# Patient Record
Sex: Male | Born: 1970 | Hispanic: No | Marital: Married | Smoking: Never smoker
Health system: Southern US, Community
[De-identification: ages and names within clinical notes are randomized; demographics above are authoritative.]

## PROBLEM LIST (undated history)

## (undated) DIAGNOSIS — E7800 Pure hypercholesterolemia, unspecified: Secondary | ICD-10-CM

## (undated) DIAGNOSIS — E119 Type 2 diabetes mellitus without complications: Secondary | ICD-10-CM

---

## 2017-08-04 ENCOUNTER — Encounter (HOSPITAL_BASED_OUTPATIENT_CLINIC_OR_DEPARTMENT_OTHER): Payer: Self-pay | Admitting: *Deleted

## 2017-08-04 ENCOUNTER — Emergency Department (HOSPITAL_BASED_OUTPATIENT_CLINIC_OR_DEPARTMENT_OTHER)
Admission: EM | Admit: 2017-08-04 | Discharge: 2017-08-04 | Disposition: A | Discharge status: Home / Self Care | Payer: 59 | Attending: Emergency Medicine | Admitting: Emergency Medicine

## 2017-08-04 ENCOUNTER — Other Ambulatory Visit: Payer: Self-pay

## 2017-08-04 ENCOUNTER — Emergency Department (HOSPITAL_BASED_OUTPATIENT_CLINIC_OR_DEPARTMENT_OTHER): Payer: 59

## 2017-08-04 DIAGNOSIS — Y999 Unspecified external cause status: Secondary | ICD-10-CM | POA: Diagnosis not present

## 2017-08-04 DIAGNOSIS — E119 Type 2 diabetes mellitus without complications: Secondary | ICD-10-CM | POA: Diagnosis not present

## 2017-08-04 DIAGNOSIS — S93402A Sprain of unspecified ligament of left ankle, initial encounter: Secondary | ICD-10-CM | POA: Insufficient documentation

## 2017-08-04 DIAGNOSIS — X500XXA Overexertion from strenuous movement or load, initial encounter: Secondary | ICD-10-CM | POA: Insufficient documentation

## 2017-08-04 DIAGNOSIS — Y9373 Activity, racquet and hand sports: Secondary | ICD-10-CM | POA: Insufficient documentation

## 2017-08-04 DIAGNOSIS — Z7984 Long term (current) use of oral hypoglycemic drugs: Secondary | ICD-10-CM | POA: Diagnosis not present

## 2017-08-04 DIAGNOSIS — S99912A Unspecified injury of left ankle, initial encounter: Secondary | ICD-10-CM | POA: Diagnosis present

## 2017-08-04 DIAGNOSIS — Y92312 Tennis court as the place of occurrence of the external cause: Secondary | ICD-10-CM | POA: Insufficient documentation

## 2017-08-04 HISTORY — DX: Pure hypercholesterolemia, unspecified: E78.00

## 2017-08-04 HISTORY — DX: Type 2 diabetes mellitus without complications: E11.9

## 2017-08-04 MED ORDER — IBUPROFEN 800 MG PO TABS
800.0000 mg | ORAL_TABLET | Freq: Once | ORAL | Status: AC
  Administered 2017-08-04: 800 mg via ORAL
  Filled 2017-08-04: qty 1

## 2017-08-04 MED ORDER — HYDROCODONE-ACETAMINOPHEN 5-325 MG PO TABS: 1.0000 | ORAL_TABLET | ORAL | 0 refills | Status: AC | PRN

## 2017-08-04 MED ORDER — IBUPROFEN 600 MG PO TABS: 600.0000 mg | ORAL_TABLET | Freq: Four times a day (QID) | ORAL | 0 refills | Status: AC | PRN

## 2017-08-04 MED ORDER — HYDROCODONE-ACETAMINOPHEN 5-325 MG PO TABS
1.0000 | ORAL_TABLET | Freq: Once | ORAL | Status: AC
  Administered 2017-08-04: 1 via ORAL
  Filled 2017-08-04: qty 1

## 2017-08-04 MED FILL — HYDROCODON-APAP 5-325: 5-325 | 2 days supply | Qty: 10 | Fill #0

## 2017-08-04 MED FILL — IBUPROFEN 600 MG TABLET: 600 | 7 days supply | Qty: 30 | Fill #0

## 2017-08-04 NOTE — ED Notes (Signed)
Pt states he is diabetic and he is hungry, given saltine crackers per request.

## 2017-08-04 NOTE — ED Triage Notes (Signed)
Injury to his left ankle while playing tennis today. Swelling noted.

## 2017-08-04 NOTE — ED Provider Notes (Signed)
[facility] EMERGENCY DEPARTMENT Provider Note   CSN: [id number] Arrival date & time: 08/04/17  1129     History   Chief Complaint Chief Complaint  Patient presents with  . Ankle Injury    HPI Justin Parks is a 46 y.o. male.  HPI   Mr. Justin Parks is a 46 year old male with a history of NIDDM who presents to the emergency department for evaluation of left ankle pain.  Patient states that he was playing tennis earlier this morning when he rolled the left ankle inwards.  States that he now has 8/10 severity left lateral ankle pain with associated swelling.  Pain is "throbbing" and worsened with ankle inversion and eversion.  Improved with ice.  He has not taken any over-the-counter medications for his symptoms.  He denies fever, open wound, numbness, weakness.  He denies previous surgeries to his ankle.  He is able to ambulate independently, although painful.  Past Medical History:  Diagnosis Date  . Diabetes mellitus without complication (HCC)   . High cholesterol     There are no active problems to display for this patient.   History reviewed. No pertinent surgical history.     Home Medications    Prior to Admission medications   Medication Sig Start Date End Date Taking? Authorizing Provider  METFORMIN & DIET MANAGE PROD PO Take by mouth.   Yes [provider]  SIMVASTATIN PO Take by mouth.   Yes [provider]  SITagliptin Phosphate (JANUVIA PO) Take by mouth.   Yes [provider]    Family History No family history on file.  Social History Social History   Tobacco Use  . Smoking status: Never Smoker  . Smokeless tobacco: Never Used  Substance Use Topics  . Alcohol use: Yes    Frequency: Never  . Drug use: No     Allergies   Patient has no known allergies.   Review of Systems Review of Systems  Constitutional: Negative for chills and fever.  Musculoskeletal: Positive for arthralgias, gait problem and  joint swelling (left ankle).  Skin: Negative for color change and wound.  Neurological: Negative for weakness and numbness.     Physical Exam Updated Vital Signs BP 109/76   Pulse (!) 106   Temp 98.7 F (37.1 C) (Oral)   Resp 16   Ht 5\' 11"  (1.803 m)   Wt 68.9 kg (152 lb)   SpO2 100%   BMI 21.20 kg/m   Physical Exam  Constitutional: He is oriented to person, place, and time. He appears well-developed and well-nourished. No distress.  HENT:  Head: Normocephalic and atraumatic.  Eyes: Right eye exhibits no discharge. Left eye exhibits no discharge.  Pulmonary/Chest: Effort normal. No respiratory distress.  Musculoskeletal:  Left ankle with  tenderness to palpation around the lateral malleolus. Left lateral malleolus with obvious swelling. Full active ROM. No erythema, ecchymosis, or deformity appreciated. No break in skin. No pain to fifth metatarsal area or navicular region. Achilles intact. Good pedal pulse and cap refill of toes. Distal sensation to light touch intact in all 5 toes of the left foot.    Neurological: He is alert and oriented to person, place, and time. Coordination normal.  Skin: Skin is warm and dry. Capillary refill takes less than 2 seconds. He is not diaphoretic.  Psychiatric: He has a normal mood and affect. His behavior is normal.  Nursing note and vitals reviewed.    ED Treatments / Results  Labs (all labs ordered  are listed, but only abnormal results are displayed) Labs Reviewed - No data to display  EKG  EKG Interpretation None       Radiology Dg Ankle Complete Left  Result Date: 08/04/2017 CLINICAL DATA:  Pain following twisting injury EXAM: LEFT ANKLE COMPLETE - 3+ VIEW COMPARISON:  None. FINDINGS: Frontal, oblique, and lateral views were obtained. There is soft tissue swelling, primarily laterally. No evident fracture or joint effusion. No appreciable joint space narrowing or erosion. Ankle mortise appears intact. There is a small spur  arising from the posterior calcaneus. IMPRESSION: Soft tissue swelling, primarily laterally. No fracture. No appreciable arthropathy. Ankle mortise appears intact. Small posterior calcaneal spur noted. Electronically Signed   By: Bretta BangWilliam  Woodruff III M.D.   On: 08/04/2017 11:55    Procedures Procedures (including critical care time)  Medications Ordered in ED Medications - No data to display   Initial Impression / Assessment and Plan / ED Course  I have reviewed the triage vital signs and the nursing notes.  Pertinent labs & imaging results that were available during my care of the patient were reviewed by me and considered in my medical decision making (see chart for details).    Patient with left lateral ankle sprain. X-ray of left ankle does not reveal fracture or acute bony abnormality.  Foot is neurovascularly intact on exam. Patient sent home with ASO brace and crutches.  Have counseled him on NSAID use and RICE protocol.  Discussed follow-up with primary doctor if his symptoms are not improving in a week.  Patient agrees and voiced understanding to the above plan and has no complaints prior to discharge.  Final Clinical Impressions(s) / ED Diagnoses   Final diagnoses:  Sprain of left ankle, unspecified ligament, initial encounter    ED Discharge Orders    None       Lawrence MarseillesShrosbree, Jaqualyn Juday J, PA-C 08/04/17 1449    Raeford RazorKohut, Stephen, MD 08/05/17 1630

## 2017-08-04 NOTE — Discharge Instructions (Signed)
X-ray of your left ankle was reassuring.  No fractures or dislocations.  You have an ankle sprain.  Please ice at least twice a day for the next several days.  Elevate the ankle when you can.   Take 600mg  ibuprofen every 6hrs for pain. I have also written you a prescription for a pain medicine called norco.  It can make you drowsy so please do not drive, work or drink alcohol while taking this medicine.  Please schedule appointment your primary doctor if your symptoms are not improving in a week.  Return to the emergency department if you have any new or worsening symptoms.

## 2017-08-04 NOTE — ED Notes (Signed)
Pt educated about not driving or performing critical tasks (such as operating heavy machinery or caring for an infant/toddler/child) while taking narcotics. Educated about not mixing narcotics with alcohol and/or other sedatives (such as muscle relaxers, Benadryl). Also educated about addicting properties of narcotics. Pt/caregiver verbalized understanding.   

## 2017-08-04 NOTE — ED Notes (Signed)
Pt educated about not driving or performing other critical tasks (such as operating heavy machinery, caring for infant/toddler/child) due to sedative nature of medications received in ED. Also warned about risks of consuming alcohol or taking other medications with sedative properties. Pt/caregiver verbalized understanding.  

## 2018-06-23 IMAGING — CR DG ANKLE COMPLETE 3+V*L*
3 series · 3 of 3 positions shown · non-contrast
Comparison: None.

CLINICAL DATA: Pain following twisting injury

EXAM:
LEFT ANKLE COMPLETE - 3+ VIEW

[t ankle joint ap left]
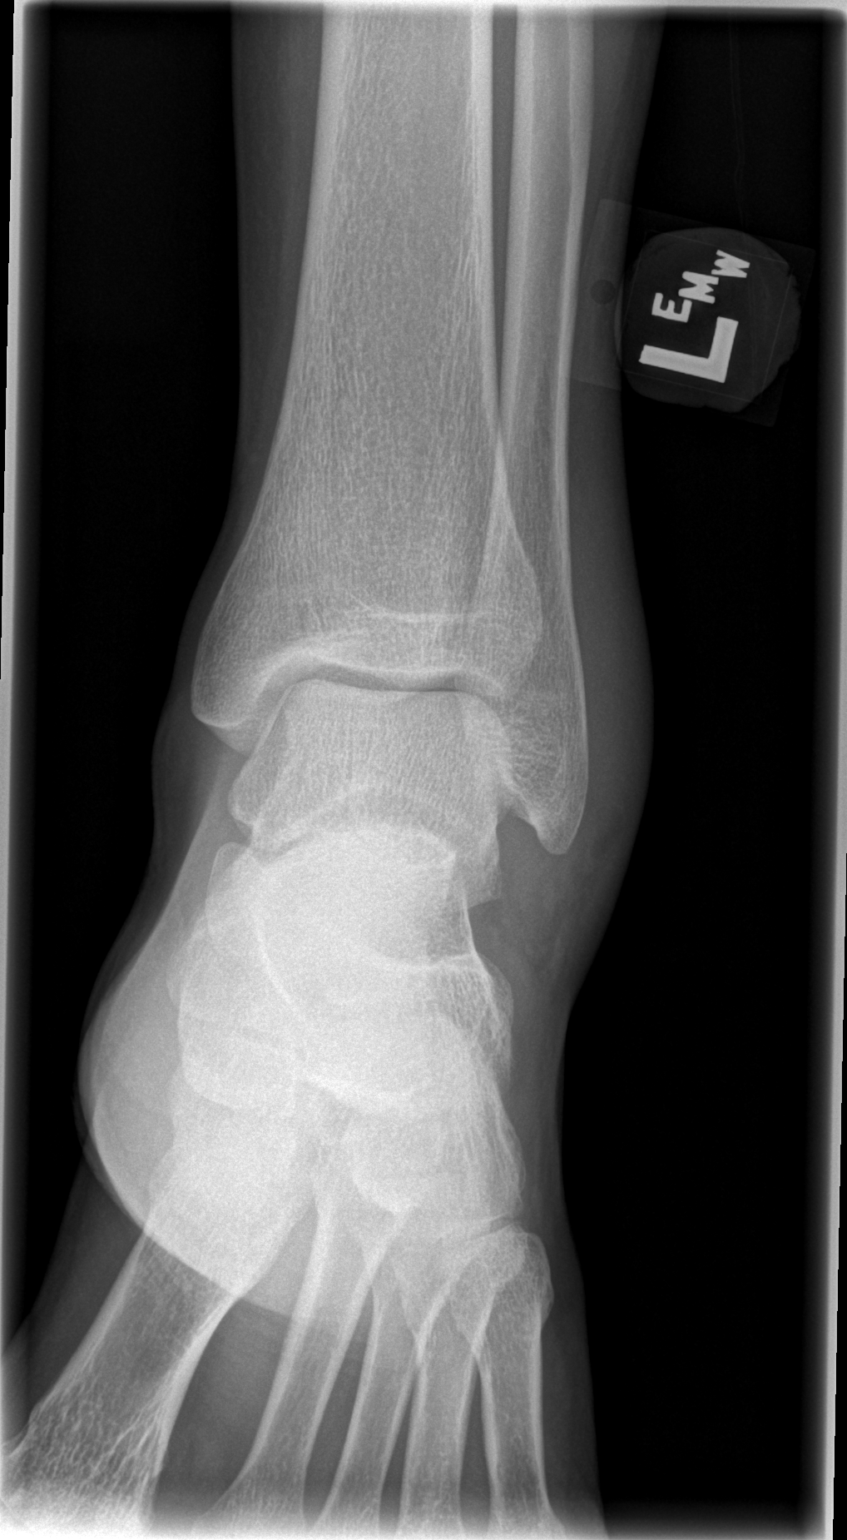

[t ankle joint oblique left]
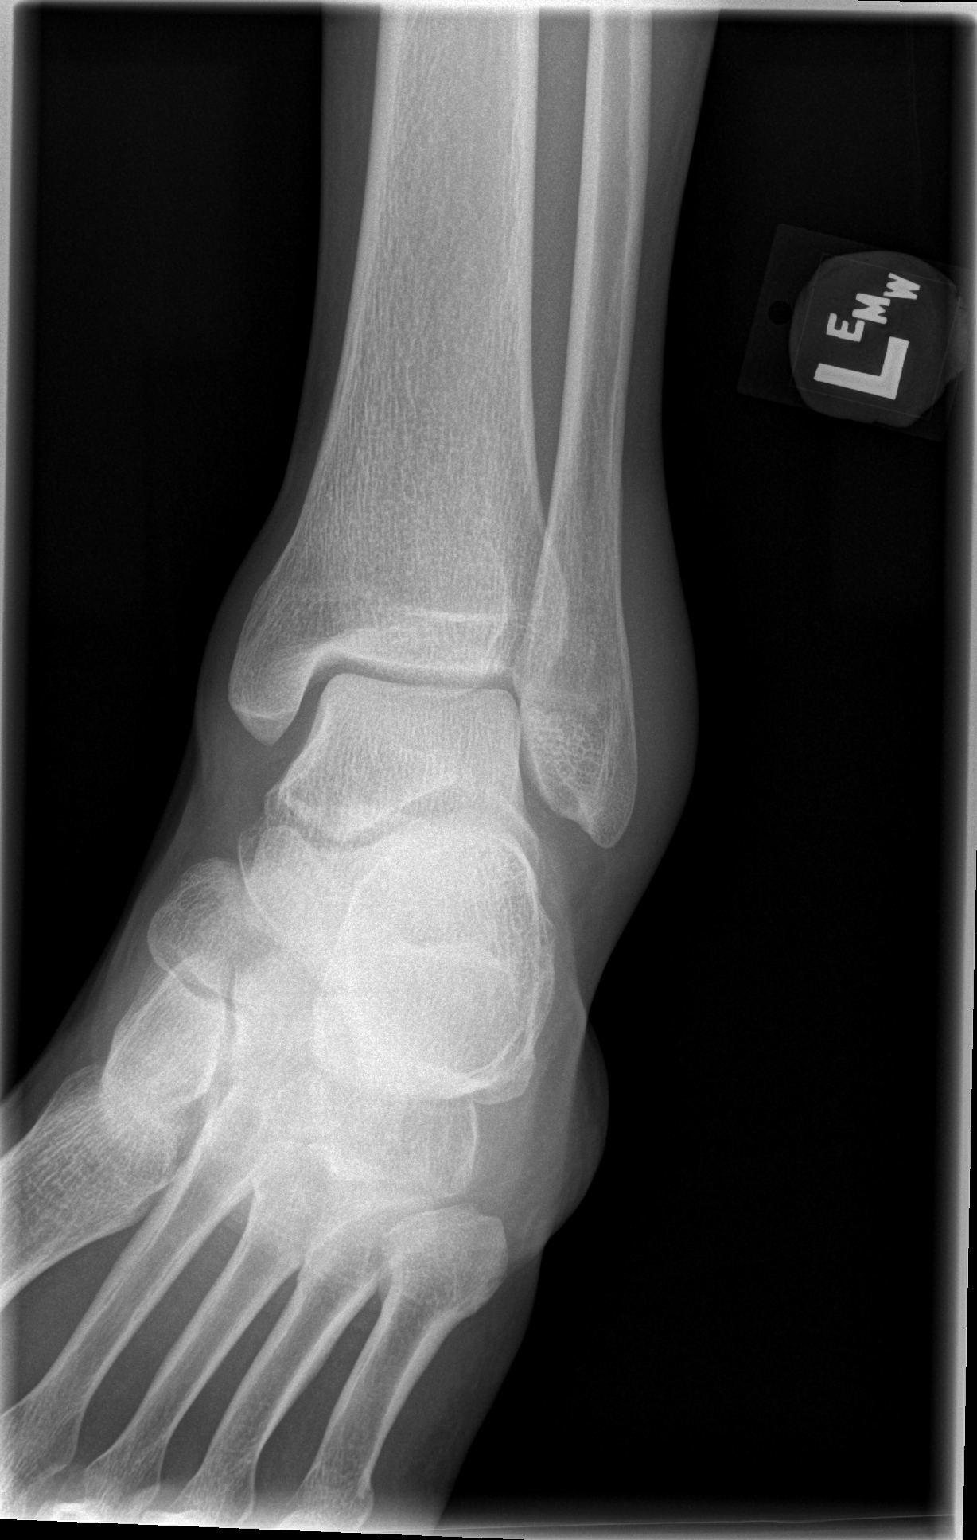

[t ankle joint lat left]
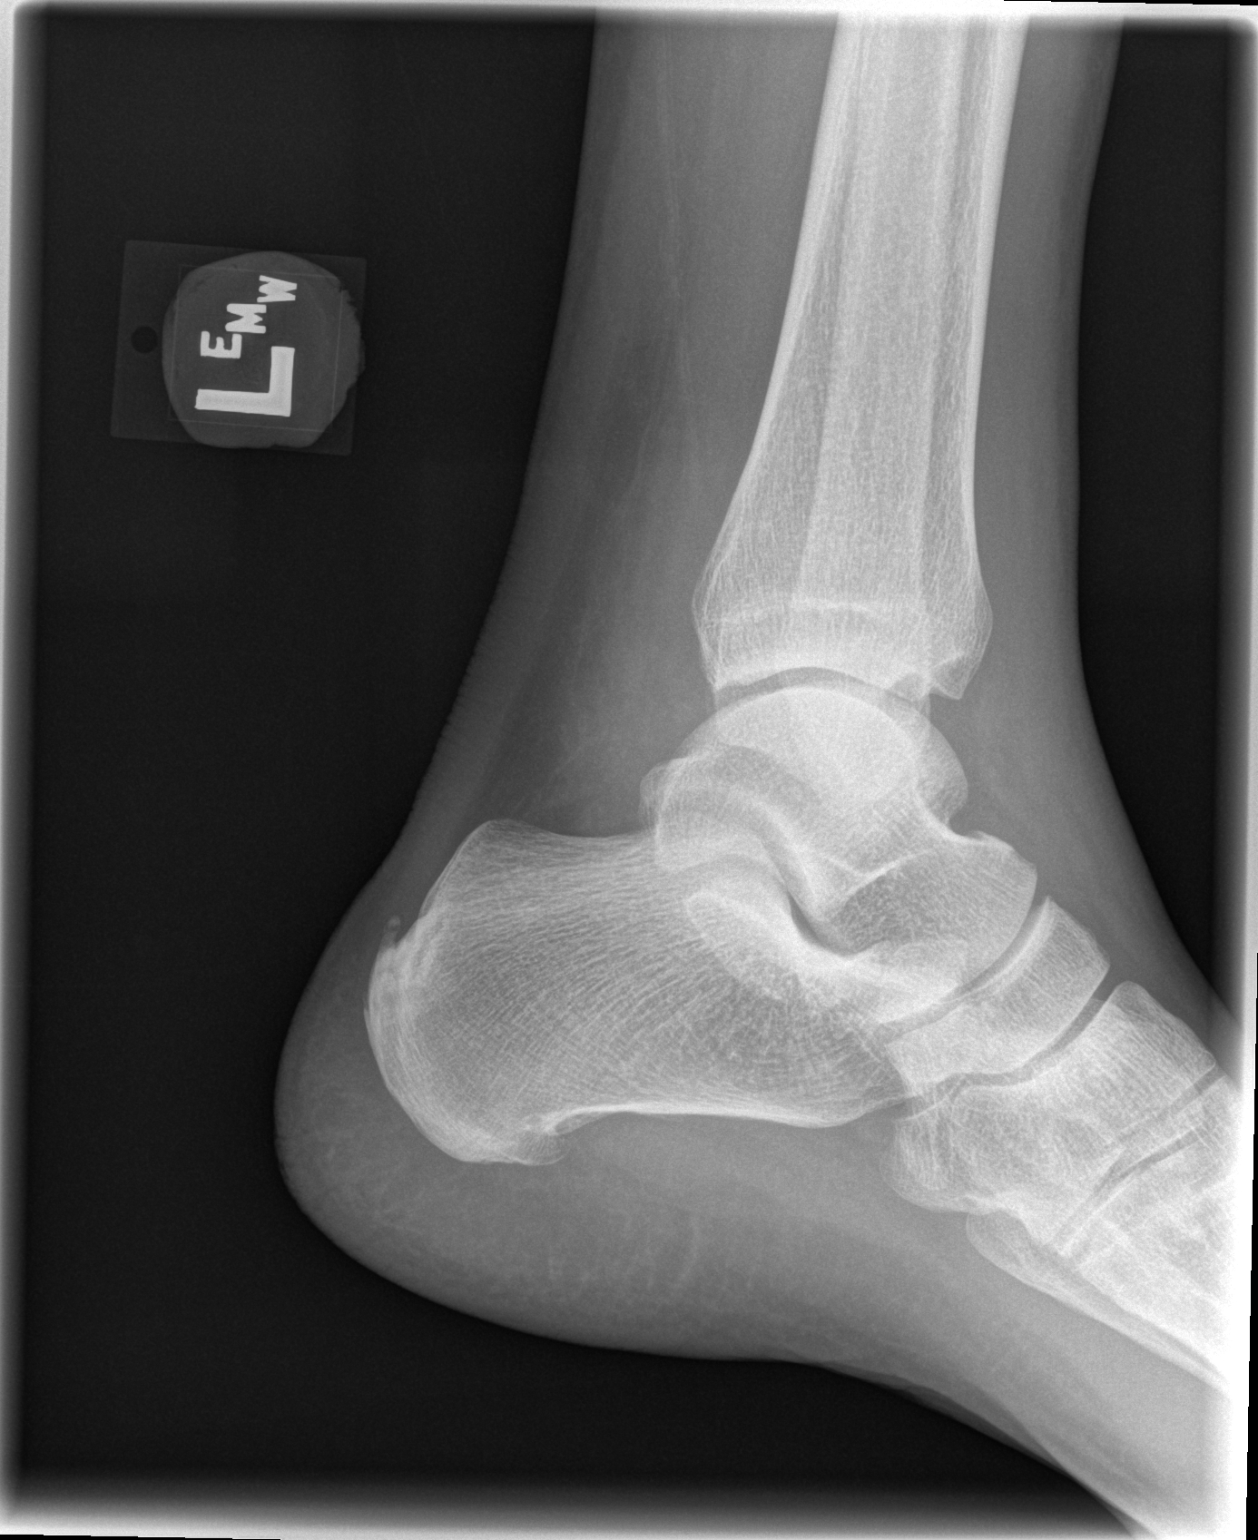

[3 of 3 positions shown; findings below may reference images not displayed]

FINDINGS: Frontal, oblique, and lateral views were obtained. There is soft
tissue swelling, primarily laterally. No evident fracture or joint
effusion. No appreciable joint space narrowing or erosion. Ankle
mortise appears intact. There is a small spur arising from the
posterior calcaneus.
IMPRESSION: Soft tissue swelling, primarily laterally. No fracture. No
appreciable arthropathy. Ankle mortise appears intact. Small
posterior calcaneal spur noted.
